# Patient Record
Sex: Female | Born: 2000 | Hispanic: Yes | Marital: Single | Smoking: Never smoker
Health system: Southern US, Community
[De-identification: ages and names within clinical notes are randomized; demographics above are authoritative.]

## PROBLEM LIST (undated history)

## (undated) ENCOUNTER — Emergency Department (HOSPITAL_COMMUNITY): Admission: EM | Payer: BLUE CROSS/BLUE SHIELD

## (undated) DIAGNOSIS — H9202 Otalgia, left ear: Secondary | ICD-10-CM

## (undated) DIAGNOSIS — F419 Anxiety disorder, unspecified: Secondary | ICD-10-CM

## (undated) HISTORY — DX: Otalgia, left ear: H92.02

## (undated) HISTORY — DX: Anxiety disorder, unspecified: F41.9

---
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
## 2022-02-10 ENCOUNTER — Encounter: Payer: Self-pay | Admitting: Pediatrics

## 2022-02-10 ENCOUNTER — Ambulatory Visit (INDEPENDENT_AMBULATORY_CARE_PROVIDER_SITE_OTHER): Payer: Medicaid Other | Admitting: Pediatrics

## 2022-02-10 VITALS — BP 104/72 | HR 74 | Ht 67.0 in | Wt 222.0 lb

## 2022-02-10 DIAGNOSIS — L700 Acne vulgaris: Secondary | ICD-10-CM | POA: Diagnosis not present

## 2022-02-10 DIAGNOSIS — Z68.41 Body mass index (BMI) pediatric, greater than or equal to 95th percentile for age: Secondary | ICD-10-CM | POA: Diagnosis not present

## 2022-02-10 DIAGNOSIS — Z114 Encounter for screening for human immunodeficiency virus [HIV]: Secondary | ICD-10-CM | POA: Diagnosis not present

## 2022-02-10 DIAGNOSIS — Z23 Encounter for immunization: Secondary | ICD-10-CM | POA: Diagnosis not present

## 2022-02-10 DIAGNOSIS — Z0000 Encounter for general adult medical examination without abnormal findings: Secondary | ICD-10-CM

## 2022-02-10 DIAGNOSIS — Z113 Encounter for screening for infections with a predominantly sexual mode of transmission: Secondary | ICD-10-CM

## 2022-02-10 DIAGNOSIS — E669 Obesity, unspecified: Secondary | ICD-10-CM

## 2022-02-10 DIAGNOSIS — H9313 Tinnitus, bilateral: Secondary | ICD-10-CM | POA: Diagnosis not present

## 2022-02-10 DIAGNOSIS — Z1389 Encounter for screening for other disorder: Secondary | ICD-10-CM

## 2022-02-10 LAB — POCT RAPID HIV: Rapid HIV, POC: NEGATIVE

## 2022-02-10 NOTE — Progress Notes (Signed)
Adolescent Well Care Visit Teriann Livingood is a 21 y.o. female who is here for well care.    PCP:  Ancil Linsey, MD   History was provided by the patient.  Confidentiality was discussed with the patient and, if applicable, with caregiver as well. Patient's personal or confidential phone number: [phone]   Current Issues: Current concerns include  Tinnitus - continued left sided ear tinnitus that is now muffled sounding.   Completed first year of college.  States that learning to balance her time was difficult.  Working at [employer].  Thinking about a career in medical field and enjoys working with children.  Volunteers at [employer].   Nutrition: Nutrition/Eating Behaviors: Trying to have a healthier relationship with food and make healthy food choices.  Adequate calcium in diet?: yes  Supplements/ Vitamins: none    Menstruation:   Patient's last menstrual period was 01/31/2022. Menstrual History: coming monthly- encouraged tracker.   Not yet had any sexual experiences.    Confidential Social History: Tobacco?  no Secondhand smoke exposure?  no Drugs/ETOH?  no  Sexually Active?  no   Pregnancy Prevention: n/a  Safe at home, in school & in relationships?  Yes Safe to self?  Yes   Screenings: Patient has a dental home: yes  Physical Exam:  Vitals:   02/10/22 1113  BP: 104/72  Pulse: 74  Weight: 222 lb (100.7 kg)  Height: 5\' 7"  (1.702 m)   BP 104/72   Pulse 74   Ht 5\' 7"  (1.702 m)   Wt 222 lb (100.7 kg)   LMP 01/31/2022   BMI 34.77 kg/m  Body mass index: body mass index is 34.77 kg/m. Blood pressure percentiles are not available for patients who are 18 years or older.  Hearing Screening   500Hz  1000Hz  2000Hz  4000Hz   Right ear 20 20 20 20   Left ear 20 20 20 20    Vision Screening   Right eye Left eye Both eyes  Without correction 10/10 10/10 10/10   With correction       General Appearance:   alert, oriented, no acute distress  and well nourished  HENT: Normocephalic, no obvious abnormality, conjunctiva clear  Mouth:   Normal appearing teeth, no obvious discoloration, dental caries, or dental caps  Neck:   Supple; thyroid: no enlargement, symmetric, no tenderness/mass/nodules  Chest   Lungs:   Clear to auscultation bilaterally, normal work of breathing  Heart:   Regular rate and rhythm, S1 and S2 normal, no murmurs;   Abdomen:   Soft, non-tender, no mass, or organomegaly  GU genitalia not examined  Musculoskeletal:   Tone and strength strong and symmetrical, all extremities               Lymphatic:   No cervical adenopathy  Skin/Hair/Nails:   Skin warm, dry and intact, no rashes, no bruises or petechiae  Neurologic:   Strength, gait, and coordination normal and age-appropriate     Assessment and Plan:   Milana is an 21 yo F with longstanding history of tinnitus here for well exam .  Options limited to tinnitus clinic at [facility] as they did not take isurance. Patient requesting new referral for new opinion.  Transition of care discussed at great length.  Will be establishing care with same practice as mom.    Hearing screening result:normal Vision screening result: normal  Counseling provided for all of the vaccine components  Orders Placed This Encounter  Procedures   Ambulatory referral to ENT  Ambulatory referral to Dermatology   POCT Rapid HIV     Return in 1 year (on 02/11/2023), or if symptoms worsen or fail to improve.Marland Kitchen  Ancil Linsey, MD

## 2022-04-08 ENCOUNTER — Telehealth: Payer: Self-pay | Admitting: Pediatrics

## 2022-04-08 NOTE — Telephone Encounter (Signed)
Patient has not received call from dermatologist or ENT that she was referred to in June2023 .Are we able to check on these referrals ? Patient will be at work , provided mom # for call back  [phone]

## 2022-04-09 NOTE — Telephone Encounter (Signed)
I called and spoke with mom and daughter to inform they have been contacting daughter to get the appointment scheduled. That is the number that is on file. I provided daughter with the two numbers to contact there office and they can help with getting an appointment scheduled.

## 2022-06-09 ENCOUNTER — Ambulatory Visit (INDEPENDENT_AMBULATORY_CARE_PROVIDER_SITE_OTHER): Payer: Medicaid Other | Admitting: Student

## 2022-06-09 VITALS — BP 102/64 | HR 74 | Ht 67.0 in | Wt 201.0 lb

## 2022-06-09 DIAGNOSIS — B079 Viral wart, unspecified: Secondary | ICD-10-CM | POA: Insufficient documentation

## 2022-06-09 DIAGNOSIS — L700 Acne vulgaris: Secondary | ICD-10-CM | POA: Insufficient documentation

## 2022-06-09 MED ORDER — BENZOYL PEROXIDE-ERYTHROMYCIN 5-3 % EX GEL: Freq: Two times a day (BID) | CUTANEOUS | 0 refills | Status: AC

## 2022-06-09 NOTE — Patient Instructions (Signed)
It was great to see you! Thank you for allowing me to participate in your care!  Our plans for today:  - Acne  Benzamycin to skin twice a day, after washing  -Scars/Stretch marks  Dermatology referral for treatment   - Mertha Finders You have an appointment in  Derm procedure clinic, next Thursday 06/17/22 at 3:30 pm  Will have the wart frozen off, may need multiple treatments   Take care and seek immediate care sooner if you develop any concerns.   Dr. Holley Bouche, MD [place]

## 2022-06-09 NOTE — Progress Notes (Signed)
Theresa Sellers SUBJECTIVE:   CHIEF COMPLAINT / HPI:   Worried about facial acne that she gets around her cheeks. Want's to know what can be done about scars. Also has ball on finger. Also wondering if there is anything that can be done about stretch marks under her arm.   Bump on finger: Been there for 2.5 months. No irritation from it, and it randomly appeared. Has gotten bigger after first month but now has stayed same size. Bump is half centimeter in diameter.  Acne: Never seen MD for it. Has tried exfoliants and facial scrubs. Hasn't been as bad recently, but usually appreciates new pimples every couple of weeks. Acne was most notable around age of 21 and has been an issue since. Usually has on face around chin/cheeks and forehead. Can get large but usually doesn't have white tip on it.   PERTINENT  PMH / PSH: Acne   OBJECTIVE:  BP 102/64   Pulse 74   Ht 5\' 7"  (1.702 m)   Wt 201 lb (91.2 kg)   LMP 05/10/2022   SpO2 98%   BMI 31.48 kg/m   General: NAD, pleasant, able to participate in exam Skin: warm and dry, closed comedones noted around border of face, between eye brows, and on chin, no erythema, pustules or nodules, scarring noted Neuro: alert, no obvious focal deficits, speech normal Psych: Normal affect and mood  ASSESSMENT/PLAN:  Acne vulgaris Patient has had acne since age 21, but complains of worsening symptoms. Notes worsening acne on face sporadically, but has never been treated for it. Patient face showing closed comedones and scarring on cheeks. Acne is mild. Patient would benefit from benzyol peroxide and antibiotic topical cream. Will place derm referral for scarring/strech marks. -Benziclin -Amb Derm Ref     Wart of hand Patient appreciates bump on finger for last 2.5 months. Lesion appears to be wart, just under half centimeter in diameter located on left index finger at nail bed. Lesion most concerning for wart. Lesion non fluctuant/not irritating patient, making  cyst/abscess less likely. Patient wanting wart removal, will schedule for Derm Procedure Clinic. -F/u Derm Procedure Clinic for removal     Orders Placed This Encounter  Procedures   Ambulatory referral to Dermatology    Referral Priority:   Routine    Referral Type:   Consultation    Referral Reason:   Specialty Services Required    Requested Specialty:   Dermatology    Number of Visits Requested:   1   Meds ordered this encounter  Medications   benzoyl peroxide-erythromycin (BENZAMYCIN) gel    Sig: Apply topically 2 (two) times daily.    Dispense:  23.3 g    Refill:  0   No follow-ups on file. @SIGNNOTE @

## 2022-06-09 NOTE — Assessment & Plan Note (Signed)
Patient appreciates bump on finger for last 2.5 months. Lesion appears to be wart, just under half centimeter in diameter located on left index finger at nail bed. Lesion most concerning for wart. Lesion non fluctuant/not irritating patient, making cyst/abscess less likely. Patient wanting wart removal, will schedule for Derm Procedure Clinic. -F/u Derm Procedure Clinic for removal

## 2022-06-09 NOTE — Assessment & Plan Note (Addendum)
Patient has had acne since age 21, but complains of worsening symptoms. Notes worsening acne on face sporadically, but has never been treated for it. Patient face showing closed comedones and scarring on cheeks. Acne is mild. Patient would benefit from benzyol peroxide and antibiotic topical cream. Will place derm referral for scarring/strech marks. -Benziclin -Amb Derm Ref

## 2022-06-17 ENCOUNTER — Ambulatory Visit (INDEPENDENT_AMBULATORY_CARE_PROVIDER_SITE_OTHER): Payer: Medicaid Other | Admitting: Family Medicine

## 2022-06-17 VITALS — BP 108/80 | HR 73 | Ht 67.0 in | Wt 199.8 lb

## 2022-06-17 DIAGNOSIS — B079 Viral wart, unspecified: Secondary | ICD-10-CM | POA: Diagnosis not present

## 2022-06-17 NOTE — Assessment & Plan Note (Signed)
<  1cm wart on left index finger that appeared 3 months ago. No associated pain. - Cryotherapy of wart performed - Reevaluate in office in 2-3 weeks

## 2022-06-17 NOTE — Progress Notes (Signed)
    SUBJECTIVE:   HPI:  Finger lesion x 3 months. See medical student's note above.   Cryotherapy  Preoperative diagnosis: Left index finger wart  Postoperative diagnosis: same  Procedure: Cryotherapy of Cervix  Surgeon: Andrena Mews  Preprocedure counseling: The risks, benefits, and alternatives of the procedure were discussed with the patient.    EBL: 0 ml  Anesthesia: None   Procedure:  Lesions identified: The faulx swab dabbed in liquid nitrogen was applied to the lesion on her left index finger for 3 seconds until an ice ball formed with a 5-7 mm border.  This was allowed to thaw and then the cryotherapy was again applied for 3 seconds to an ice ball of 5-7 mm x 2.     The patient tolerated the procedure well.  Return precautions provided.   Return to the office in 2-3 weeks for reevaluation.

## 2022-06-17 NOTE — Patient Instructions (Signed)
Cryosurgery for Skin Conditions Cryosurgery is the use of a very cold liquid (liquid nitrogen) to treat abnormal or diseased tissue. This treatment is also called cryotherapy. It can freeze or take away growths on skin, such as: Warts. Skin sores that could become cancer. Some skin cancers. This treatment normally takes a few minutes. It can be done in your doctor's office. Tell a doctor about: Any allergies you have. All medicines you are taking, including vitamins, herbs, eye drops, creams, and over-the-counter medicines. Any problems you or family members have had with medicines that make you fall asleep (anesthetic medicines). Any blood disorders you have. Any surgeries you have had. Any medical conditions you have. Whether you are pregnant or may be pregnant. What are the risks? Generally, this is a safe treatment. However, problems may occur, including: Infection. Bleeding. Scarring. Changes in skin color (lighter or darker than normal skin tone). Swelling. Hair loss in the treated area. Damage to nearby parts or organs, such as nerve damage and loss of feeling. This is rare. What happens before the procedure? You do not have to do anything to get ready for this treatment. Your doctor will talk with you about: The treatment. The benefits and risks. What happens during the procedure?  Your treatment will be done in one of these two ways: Your doctor may use a tool (probe) on your skin. The tool has very cold liquid in it to cool it down. The tool will be used until the skin is frozen and killed. Your doctor may use a swab or spray to get the very cold liquid onto your skin. Your doctor will keep using the very cold liquid until the skin is frozen and killed. A bandage (dressing) may be put on the area. These procedures may vary among doctors and clinics. What can I expect after the treatment? After your treatment, it is common to have: Redness over the treated area. Swelling  over the treated area. A blister that forms over the treated area. The blister may have a little blood in it. You may also have some mild stinging or a burning feeling that will go away. If a blister forms, it will break open on its own after about 2-4 weeks. This will leave a scab. Then the treated area will heal. After healing, there is normally little or no scarring. Follow these instructions at home: Caring for the treated area  Follow instructions from your doctor about how to take care of your treated area. If you have a bandage, make sure you: Wash your hands with soap and water for at least 20 seconds before and after you change your bandage. If you cannot use soap and water, use hand sanitizer. Change your bandage as told by your doctor. Keep the bandage and the treated area clean and dry. If the bandage gets wet, change it right away. Clean the treated area with soap and water. Keep the area covered with a bandage until it heals, or for as long as told by your doctor. Check the treated area every day for signs of infection. Check for: More redness, swelling, or pain. More fluid or blood. Warmth. Pus or a bad smell. If you have a blister: Do not pick at it. Doing this can cause infection and scarring. Do not try to break it open. Doing this can cause infection and scarring. Do not put any medicine, cream, or lotion on the treated area unless told by your doctor. Bathing Until your doctor approves: Do   not take baths. Do not swim. Do not use a hot tub. Do not hand-wash dishes. Do not soak the treated area in other ways. Ask your doctor if you may take showers. You may only be allowed to take sponge baths. General instructions Take over-the-counter and prescription medicines only as told by your doctor. Do not use any products that contain nicotine or tobacco, such as cigarettes, e-cigarettes, and chewing tobacco. These can delay healing. If you need help quitting, ask your  doctor. Keep all follow-up visits as told by your doctor. This is important. Contact a doctor if: You have any of these signs of infection in or around your treated area: More redness. More swelling. More pain. More fluid. More blood. Warmth. Pus or a bad smell. Your blister grows large and causes pain. Get help right away if: You have a fever. You have redness that spreads from the treated area. Summary Cryosurgery uses a very cold liquid to freeze or take away growths on skin. It is also called cryotherapy. Generally, this is a safe treatment. You do not have to do anything to get ready for it. Your doctor will freeze or kill the skin in one of two ways. One way is with a tool (probe) that has the very cold liquid in it. The other way is to spread the very cold liquid onto your skin with a swab or spray. After your treatment, follow care instructions from your doctor. Watch for infection. If you have a blister, do not pick at it or break it open. This information is not intended to replace advice given to you by your health care provider. Make sure you discuss any questions you have with your health care provider. Document Revised: 04/11/2019 Document Reviewed: 04/11/2019 Elsevier Patient Education  2023 [employer] Inc[employer]  

## 2022-06-17 NOTE — Progress Notes (Signed)
    Subjective: Patient here for wart on left index finger that appeared 3 months ago. Initially it grew in size and it appears to be stable. Patient denies pain. Notable history of similar wart on mom's finger. She asked for details regarding the cryotherapy procedure and signed informed consent.  Objective: Blood pressure 108/80, pulse 73, height 5\' 7"  (1.702 m), weight 199 lb 12.8 oz (90.6 kg), last menstrual period 05/31/2022, SpO2 99 %.   Physical Exam: Derm: <1cm raised wart on left index finger  Assessment & Plan:  Wart of Finger <1cm wart on left index finger that appeared 3 months ago. No associated pain. - Cryotherapy of wart performed - Reevaluate in office in 2-3 weeks  Cryotherapy  Preoperative diagnosis: Left index finger wart  Postoperative diagnosis: same  Procedure: Cryotherapy of wart on finger  Surgeon: Andrena Mews  Preprocedure counseling: The risks, benefits, and alternatives of the procedure were discussed with the patient.    EBL: 0 ml  Anesthesia: None   Procedure:  Lesions identified: The faulx swab dabbed in liquid nitrogen was applied to the lesion on her left index finger for 3 seconds until an ice ball formed with a 5-7 mm border.  This was allowed to thaw and then the cryotherapy was again applied for 3 seconds to an ice ball of 5-7 mm x 2.     The patient tolerated the procedure well.  Return precautions provided.   Return to the office in 2-3 weeks for reevaluation.

## 2022-07-20 ENCOUNTER — Ambulatory Visit: Payer: Medicaid Other

## 2022-08-19 ENCOUNTER — Ambulatory Visit
Admission: EM | Admit: 2022-08-19 | Discharge: 2022-08-19 | Disposition: A | Discharge status: Home / Self Care | Payer: Medicaid Other | Attending: Physician Assistant | Admitting: Physician Assistant

## 2022-08-19 DIAGNOSIS — B070 Plantar wart: Secondary | ICD-10-CM | POA: Diagnosis not present

## 2022-08-19 MED ORDER — SALICYLIC ACID 27.5 % EX LIQD: CUTANEOUS | 0 refills | Status: AC

## 2022-08-19 NOTE — ED Provider Notes (Signed)
[name]    CSN: [id number] Arrival date & time: 08/19/22  1102      History   Chief Complaint Chief Complaint  Patient presents with   Foreign Body    HPI Theresa Sellers is a 21 y.o. female.   Patient here today for evaluation of possible foreign body to right foot. She reports that she felt as if she stepped on something about 2 weeks ago but did not see anything in the floor when symptoms started. She reports that a few hours after initial discomfort she did try to express FB if present but did not have anything come from area of discomfort. She has not had fever. Denies numbness or other symptoms.  The history is provided by the patient.  Foreign Body Associated symptoms: no nausea and no vomiting     Past Medical History:  Diagnosis Date   Anxious mood    Influenza A 10/02/2018   Otalgia of left ear     Patient Active Problem List   Diagnosis Date Noted   Acne vulgaris 06/09/2022   Wart of hand 06/09/2022   Abscess of superficial perineal space 01/22/2020   Vitamin D deficiency 07/24/2019   BMI (body mass index), pediatric, greater than or equal to 95% for age 59/12/2018   Anxiety 07/11/2019   Failed hearing screening 07/11/2019   Middle ear effusion, left 07/11/2019    History reviewed. No pertinent surgical history.  OB History   No obstetric history on file.      Home Medications    Prior to Admission medications   Medication Sig Start Date End Date Taking? Authorizing Provider  Salicylic Acid 27.5 % LIQD Soak foot in warm water for 5 minutes then dry- apply salicylic acid to entire wart surface, allow to dry then reapply. Use 1-2 times daily for 4 weeks or until wart resolves. 08/19/22  Yes Tomi Bamberger, PA-C  benzoyl peroxide-erythromycin [facility]) gel Apply topically 2 (two) times daily. 06/09/22   Bess Kinds, MD  cetirizine (ZYRTEC) 10 MG tablet Take 10 mg by mouth as needed for allergies.    [provider]   [facility] D3 10 MCG (400 UNIT) CAPS TAKE 1 TABLET (400 UNITS TOTAL) BY MOUTH DAILY. 12/18/19   Kalman Jewels, MD  fexofenadine (ALLEGRA) 180 MG tablet Take 1 tablet (180 mg total) by mouth daily. 05/04/19 06/03/19  Stryffeler, Jonathon Jordan, NP  fluticasone (FLONASE) 50 MCG/ACT nasal spray Place 1 spray into both nostrils daily. 09/25/19   Florestine Avers [place], MD  ibuprofen (ADVIL) 200 MG tablet Take 200 mg by mouth every 6 (six) hours as needed.    [provider]  Magnesium Oxide 500 MG TABS Take 1 tablet (500 mg total) by mouth daily. 06/11/20   Keturah Shavers, MD  mupirocin ointment (BACTROBAN) 2 % Apply 1 application topically 2 (two) times daily. 01/22/20   Darrall Dears, MD  polyethylene glycol powder (GLYCOLAX/MIRALAX) 17 GM/SCOOP powder Take 17 g by mouth daily. 11/27/21   Ancil Linsey, MD  riboflavin (VITAMIN B-2) 100 MG TABS tablet Take 1 tablet (100 mg total) by mouth daily. 06/11/20   Keturah Shavers, MD  topiramate (TOPAMAX) 50 MG tablet Take 1 tablet (50 mg total) by mouth 2 (two) times daily. (Start with half a tablet twice daily for the first week) 11/04/20   Keturah Shavers, MD    Family History Family History  Problem Relation Age of Onset   Cancer Maternal Grandmother    Hypertension Maternal Grandfather  Childhood respiratory disease Brother 6   Obesity Brother 5   Developmental delay Brother 5   Gait disorder Brother 7   Bowel Disease Brother 6    (Bilateral pes planus) Brother 7    (Enlarged tonsils) Brother 6    (Failed hearing screening) Brother 5    (Influenza A) Brother 7    (Lymph node enlargement) Brother 6    (Nevus) Brother 6    (Pterygium of right eye) Brother 5   Migraines Neg Hx    Seizures Neg Hx    Autism Neg Hx    ADD / ADHD Neg Hx    Anxiety disorder Neg Hx    Depression Neg Hx    Bipolar disorder Neg Hx    Schizophrenia Neg Hx     Social History Social History   Tobacco Use   Smoking status: Never   Smokeless tobacco: Never   Substance Use Topics   Alcohol use: No   Drug use: Never     Allergies   Patient has no known allergies.   Review of Systems Review of Systems  Constitutional:  Negative for chills and fever.  Eyes:  Negative for discharge and redness.  Gastrointestinal:  Negative for nausea and vomiting.  Musculoskeletal:  Negative for arthralgias and joint swelling.  Skin:  Negative for color change and wound.     Physical Exam Triage Vital Signs ED Triage Vitals  Enc Vitals Group     BP 08/19/22 1209 110/77     Pulse Rate 08/19/22 1209 75     Resp 08/19/22 1209 18     Temp 08/19/22 1209 98.2 F (36.8 C)     Temp Source 08/19/22 1209 Oral     SpO2 08/19/22 1209 98 %     Weight --      Height --      Head Circumference --      Peak Flow --      Pain Score 08/19/22 1211 2     Pain Loc --      Pain Edu? --      Excl. in GC? --    No data found.  Updated Vital Signs BP 110/77 (BP Location: Left Arm)   Pulse 75   Temp 98.2 F (36.8 C) (Oral)   Resp 18   LMP  (LMP Unknown)   SpO2 98%     Physical Exam Vitals and nursing note reviewed.  Constitutional:      General: She is not in acute distress.    Appearance: Normal appearance. She is not ill-appearing.  HENT:     Head: Normocephalic and atraumatic.  Eyes:     Conjunctiva/sclera: Conjunctivae normal.  Cardiovascular:     Rate and Rhythm: Normal rate.  Pulmonary:     Effort: Pulmonary effort is normal. No respiratory distress.  Skin:    Comments: Annular rough slightly raised lesion to right sole of foot without obvious wound, erythema  Neurological:     Mental Status: She is alert.  Psychiatric:        Mood and Affect: Mood normal.        Behavior: Behavior normal.        Thought Content: Thought content normal.      UC Treatments / Results  Labs (all labs ordered are listed, but only abnormal results are displayed) Labs Reviewed - No data to display  EKG   Radiology No results  found.  Procedures Procedures (including critical care time)  Medications Ordered  in UC Medications - No data to display  Initial Impression / Assessment and Plan / UC Course  I have reviewed the triage vital signs and the nursing notes.  Pertinent labs & imaging results that were available during my care of the patient were reviewed by me and considered in my medical decision making (see chart for details).    Suspect plantar wart vs corn to bottom of right foot. Recommend salicylic acid and follow up if no gradual improvement or with any further concerns. Patient expresses understanding.   Final Clinical Impressions(s) / UC Diagnoses   Final diagnoses:  Plantar wart of left foot   Discharge Instructions   None    ED Prescriptions     Medication Sig Dispense Auth. Provider   Salicylic Acid 27.5 % LIQD Soak foot in warm water for 5 minutes then dry- apply salicylic acid to entire wart surface, allow to dry then reapply. Use 1-2 times daily for 4 weeks or until wart resolves. 10 mL Tomi Bamberger, PA-C      PDMP not reviewed this encounter.   Tomi Bamberger, PA-C 08/19/22 1307

## 2022-08-19 NOTE — ED Triage Notes (Signed)
Pt presents with foreign body in bottom of right foot X 2 weeks.

## 2022-12-16 NOTE — Progress Notes (Unsigned)
A user error has taken place: encounter opened in error, closed for administrative reasons.Cancelled

## 2023-05-16 ENCOUNTER — Ambulatory Visit (INDEPENDENT_AMBULATORY_CARE_PROVIDER_SITE_OTHER): Payer: Medicaid Other | Admitting: Family Medicine

## 2023-05-16 VITALS — BP 102/68 | Ht 67.0 in | Wt 200.0 lb

## 2023-05-16 DIAGNOSIS — M2142 Flat foot [pes planus] (acquired), left foot: Secondary | ICD-10-CM

## 2023-05-16 DIAGNOSIS — M2141 Flat foot [pes planus] (acquired), right foot: Secondary | ICD-10-CM

## 2023-05-17 ENCOUNTER — Encounter: Payer: Self-pay | Admitting: Family Medicine

## 2023-05-17 NOTE — Progress Notes (Signed)
PCP: Pcp, No  Subjective:   HPI: Patient is a 22 y.o. female here for foot, knee pain.  12/06/20: Patient reports overall she's doing well since last visit. Does get pain medial and lateral knees. Reports inserts helped with this pain. Also had pain in arches helped by the inserts. Unable to wear inserts in her soccer cleats as they're too tight. Noticed on inside of right great toe, foot developed a couple blisters.  11/16/21: Patient is doing well. Reports some anterior knee pain bilaterally especially when it's cold outside. No pain currently. Does well with arch supports though current ones are broken down. Has bought a new pair of shoes online and feel a little more supportive than older ones.  05/16/23: Patient returns for new sports insoles. No pain currently.  Past Medical History:  Diagnosis Date   Anxious mood    Influenza A 10/02/2018   Otalgia of left ear     Current Outpatient Medications on File Prior to Visit  Medication Sig Dispense Refill   benzoyl peroxide-erythromycin (BENZAMYCIN) gel Apply topically 2 (two) times daily. 23.3 g 0   cetirizine (ZYRTEC) 10 MG tablet Take 10 mg by mouth as needed for allergies.     [employer] D3 10 MCG (400 UNIT) CAPS TAKE 1 TABLET (400 UNITS TOTAL) BY MOUTH DAILY. 30 capsule 3   fexofenadine (ALLEGRA) 180 MG tablet Take 1 tablet (180 mg total) by mouth daily. 30 tablet 1   fluticasone (FLONASE) 50 MCG/ACT nasal spray Place 1 spray into both nostrils daily. 9.9 mL 11   ibuprofen (ADVIL) 200 MG tablet Take 200 mg by mouth every 6 (six) hours as needed.     Magnesium Oxide 500 MG TABS Take 1 tablet (500 mg total) by mouth daily.  0   mupirocin ointment (BACTROBAN) 2 % Apply 1 application topically 2 (two) times daily. 22 g 0   polyethylene glycol powder (GLYCOLAX/MIRALAX) 17 GM/SCOOP powder Take 17 g by mouth daily. 527 g 3   riboflavin (VITAMIN B-2) 100 MG TABS tablet Take 1 tablet (100 mg total) by mouth daily.  0   Salicylic Acid 27.5  % LIQD Soak foot in warm water for 5 minutes then dry- apply salicylic acid to entire wart surface, allow to dry then reapply. Use 1-2 times daily for 4 weeks or until wart resolves. 10 mL 0   topiramate (TOPAMAX) 50 MG tablet Take 1 tablet (50 mg total) by mouth 2 (two) times daily. (Start with half a tablet twice daily for the first week) 60 tablet 2   No current facility-administered medications on file prior to visit.    History reviewed. No pertinent surgical history.  No Known Allergies  Social History   Socioeconomic History   Marital status: Single    Spouse name: Not on file   Number of children: Not on file   Years of education: Not on file   Highest education level: Not on file  Occupational History   Not on file  Tobacco Use   Smoking status: Never   Smokeless tobacco: Never  Substance and Sexual Activity   Alcohol use: No   Drug use: Never   Sexual activity: Not Currently  Other Topics Concern   Not on file  Social History Narrative   Lives with mom, dad, sister and brother. She is in the 12th grade at [employer]   Social Determinants of Health   Financial Resource Strain: Not on file  Food Insecurity: Not on file  Transportation  Needs: Not on file  Physical Activity: Not on file  Stress: Not on file  Social Connections: Not on file  Intimate Partner Violence: Not on file    Family History  Problem Relation Age of Onset   Cancer Maternal Grandmother    Hypertension Maternal Grandfather    Childhood respiratory disease Brother 6   Obesity Brother 5   Developmental delay Brother 5   Gait disorder Brother 7   Bowel Disease Brother 6    (Bilateral pes planus) Brother 7    (Enlarged tonsils) Brother 6    (Failed hearing screening) Brother 5    (Influenza A) Brother 7    (Lymph node enlargement) Brother 6    (Nevus) Brother 6    (Pterygium of right eye) Brother 5   Migraines Neg Hx    Seizures Neg Hx    Autism Neg Hx    ADD / ADHD Neg Hx     Anxiety disorder Neg Hx    Depression Neg Hx    Bipolar disorder Neg Hx    Schizophrenia Neg Hx     BP 102/68   Ht 5\' 7"  (1.702 m)   Wt 200 lb (90.7 kg)   BMI 31.32 kg/m       No data to display             11/16/2021   10:22 AM  [facility]olescent [facility]  Frequency of at least 60 minutes physical activity (# days/week) 2    Review of Systems: See HPI above.     Objective:  Physical Exam:  Gen: NAD, comfortable in exam room  Bilateral feet: Bilateral pes planus   Assessment & Plan:  1. 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  Improved with sports insoles.  Discussed custom orthotics as well though may not be covered by insurance.  New sports insoles with scaphoid pads provided.  Tylenol, ibuprofen if needed.

## 2023-05-23 ENCOUNTER — Ambulatory Visit
Admission: EM | Admit: 2023-05-23 | Discharge: 2023-05-23 | Disposition: A | Discharge status: Home / Self Care | Payer: Medicaid Other | Attending: Internal Medicine | Admitting: Internal Medicine

## 2023-05-23 DIAGNOSIS — B354 Tinea corporis: Secondary | ICD-10-CM

## 2023-05-23 MED ORDER — CLOTRIMAZOLE 1 % EX CREA: TOPICAL_CREAM | CUTANEOUS | 0 refills | Status: DC

## 2023-05-23 NOTE — ED Provider Notes (Signed)
[name]    CSN: [id number] Arrival date & time: 05/23/23  1643      History   Chief Complaint Chief Complaint  Patient presents with   Rash    HPI Theresa Sellers is a 22 y.o. female.   Patient presents with 2 areas of circular itchy rash to left hip for the past 3 to 4 days.  Reports that her brother has a similar rash.  Denies any fever.  Has applied an anti-itch cream with minimal improvement.  Patient also wants a mole evaluated to the right hip.  Reports that it has been present for "a while" but is starting to get a black discoloration to the center.   Rash   Past Medical History:  Diagnosis Date   Anxious mood    Influenza A 10/02/2018   Otalgia of left ear     Patient Active Problem List   Diagnosis Date Noted   Acne vulgaris 06/09/2022   Wart of hand 06/09/2022   Abscess of superficial perineal space 01/22/2020   Vitamin D deficiency 07/24/2019   BMI (body mass index), pediatric, greater than or equal to 95% for age 13/12/2018   Anxiety 07/11/2019   Failed hearing screening 07/11/2019   Middle ear effusion, left 07/11/2019    History reviewed. No pertinent surgical history.  OB History   No obstetric history on file.      Home Medications    Prior to Admission medications   Medication Sig Start Date End Date Taking? Authorizing Provider  clotrimazole (LOTRIMIN) 1 % cream Apply to affected area 2 times daily 05/23/23  Yes Hondo Nanda, Briggs E, FNP  benzoyl peroxide-erythromycin [facility]) gel Apply topically 2 (two) times daily. 06/09/22   Bess Kinds, MD  cetirizine (ZYRTEC) 10 MG tablet Take 10 mg by mouth as needed for allergies.    [provider]  [facility] D3 10 MCG (400 UNIT) CAPS TAKE 1 TABLET (400 UNITS TOTAL) BY MOUTH DAILY. 12/18/19   Kalman Jewels, MD  fexofenadine (ALLEGRA) 180 MG tablet Take 1 tablet (180 mg total) by mouth daily. 05/04/19 06/03/19  Stryffeler, Jonathon Jordan, NP  fluticasone (FLONASE) 50 MCG/ACT nasal  spray Place 1 spray into both nostrils daily. 09/25/19   Florestine Avers [place], MD  ibuprofen (ADVIL) 200 MG tablet Take 200 mg by mouth every 6 (six) hours as needed.    [provider]  Magnesium Oxide 500 MG TABS Take 1 tablet (500 mg total) by mouth daily. 06/11/20   Keturah Shavers, MD  mupirocin ointment (BACTROBAN) 2 % Apply 1 application topically 2 (two) times daily. 01/22/20   Darrall Dears, MD  polyethylene glycol powder (GLYCOLAX/MIRALAX) 17 GM/SCOOP powder Take 17 g by mouth daily. 11/27/21   Ancil Linsey, MD  riboflavin (VITAMIN B-2) 100 MG TABS tablet Take 1 tablet (100 mg total) by mouth daily. 06/11/20   Keturah Shavers, MD  Salicylic Acid 27.5 % LIQD Soak foot in warm water for 5 minutes then dry- apply salicylic acid to entire wart surface, allow to dry then reapply. Use 1-2 times daily for 4 weeks or until wart resolves. 08/19/22   Tomi Bamberger, PA-C  topiramate (TOPAMAX) 50 MG tablet Take 1 tablet (50 mg total) by mouth 2 (two) times daily. (Start with half a tablet twice daily for the first week) 11/04/20   Keturah Shavers, MD    Family History Family History  Problem Relation Age of Onset   Cancer Maternal Grandmother    Hypertension Maternal Grandfather  Childhood respiratory disease Brother 6   Obesity Brother 5   Developmental delay Brother 5   Gait disorder Brother 7   Bowel Disease Brother 6    (Bilateral pes planus) Brother 7    (Enlarged tonsils) Brother 6    (Failed hearing screening) Brother 5    (Influenza A) Brother 7    (Lymph node enlargement) Brother 6    (Nevus) Brother 6    (Pterygium of right eye) Brother 5   Migraines Neg Hx    Seizures Neg Hx    Autism Neg Hx    ADD / ADHD Neg Hx    Anxiety disorder Neg Hx    Depression Neg Hx    Bipolar disorder Neg Hx    Schizophrenia Neg Hx     Social History Social History   Tobacco Use   Smoking status: Never   Smokeless tobacco: Never  Substance Use Topics   Alcohol use: No   Drug  use: Never     Allergies   Patient has no known allergies.   Review of Systems Review of Systems Per HPI  Physical Exam Triage Vital Signs ED Triage Vitals  Encounter Vitals Group     BP 05/23/23 1703 113/73     Systolic BP Percentile --      Diastolic BP Percentile --      Pulse Rate 05/23/23 1703 (!) 104     Resp 05/23/23 1703 16     Temp 05/23/23 1703 98.4 F (36.9 C)     Temp Source 05/23/23 1703 Oral     SpO2 05/23/23 1703 96 %     Weight 05/23/23 1702 203 lb (92.1 kg)     Height 05/23/23 1702 5\' 7"  (1.702 m)     Head Circumference --      Peak Flow --      Pain Score 05/23/23 1702 0     Pain Loc --      Pain Education --      Exclude from Growth Chart --    No data found.  Updated Vital Signs BP 113/73 (BP Location: Left Arm)   Pulse (!) 104   Temp 98.4 F (36.9 C) (Oral)   Resp 16   Ht 5\' 7"  (1.702 m)   Wt 203 lb (92.1 kg)   LMP 05/17/2023 (Exact Date)   SpO2 96%   BMI 31.79 kg/m   Visual Acuity Right Eye Distance:   Left Eye Distance:   Bilateral Distance:    Right Eye Near:   Left Eye Near:    Bilateral Near:     Physical Exam Constitutional:      General: She is not in acute distress.    Appearance: Normal appearance. She is not toxic-appearing or diaphoretic.  HENT:     Head: Normocephalic and atraumatic.  Eyes:     Extraocular Movements: Extraocular movements intact.     Conjunctiva/sclera: Conjunctivae normal.  Pulmonary:     Effort: Pulmonary effort is normal.  Skin:    Comments: Patient has 2 circular, flat erythematous areas directly beside each other that are approximately 2.5 cm in diameter present to left lateral hip.  Patient has approximately 1 mm mole like lesion present to right lateral hip.  Center does have a black discoloration that is pinpoint.    Neurological:     General: No focal deficit present.     Mental Status: She is alert and oriented to person, place, and time. Mental status is at baseline.  Psychiatric:         Mood and Affect: Mood normal.        Behavior: Behavior normal.        Thought Content: Thought content normal.        Judgment: Judgment normal.      UC Treatments / Results  Labs (all labs ordered are listed, but only abnormal results are displayed) Labs Reviewed - No data to display  EKG   Radiology No results found.  Procedures Procedures (including critical care time)  Medications Ordered in UC Medications - No data to display  Initial Impression / Assessment and Plan / UC Course  I have reviewed the triage vital signs and the nursing notes.  Pertinent labs & imaging results that were available during my care of the patient were reviewed by me and considered in my medical decision making (see chart for details).     1.  Tinea corporis  Rash to left hip appears to be consistent with fungal infection of the skin.  Will treat with topical clotrimazole.  Patient advised to follow-up if symptoms persist or worsen.  2.  Mole to right hip  Patient has a mole to right hip and reports that the color has changed recently, and it does have a black discoloration to the center.  Advised patient to follow-up with her established dermatologist as it may need biopsy.  Biopsy cannot be performed here in urgent care.  Patient verbalized understanding and was agreeable with plan. Final Clinical Impressions(s) / UC Diagnoses   Final diagnoses:  Tinea corporis     Discharge Instructions      You have ringworm which is a fungal infection of the skin so I have prescribed a cream to apply to area.  This can take multiple weeks to resolve.  Follow-up with dermatologist for mole.    ED Prescriptions     Medication Sig Dispense Auth. Provider   clotrimazole (LOTRIMIN) 1 % cream Apply to affected area 2 times daily 60 g Gustavus Bryant, [place]      PDMP not reviewed this encounter.   Gustavus Bryant, [place] 05/23/23 1800

## 2023-05-23 NOTE — ED Triage Notes (Signed)
Patient here today with c/o circular rash on left hip X 3-4 days. She has been using an anti itch cream with no relief.

## 2023-05-23 NOTE — Discharge Instructions (Signed)
You have ringworm which is a fungal infection of the skin so I have prescribed a cream to apply to area.  This can take multiple weeks to resolve.  Follow-up with dermatologist for mole.

## 2023-07-21 ENCOUNTER — Ambulatory Visit (INDEPENDENT_AMBULATORY_CARE_PROVIDER_SITE_OTHER): Payer: Medicaid Other | Admitting: Family

## 2023-07-21 ENCOUNTER — Encounter: Payer: Self-pay | Admitting: Family

## 2023-07-21 VITALS — BP 114/73 | HR 92 | Temp 98.9°F | Ht 68.25 in | Wt 204.2 lb

## 2023-07-21 DIAGNOSIS — H9202 Otalgia, left ear: Secondary | ICD-10-CM

## 2023-07-21 DIAGNOSIS — L309 Dermatitis, unspecified: Secondary | ICD-10-CM | POA: Diagnosis not present

## 2023-07-21 DIAGNOSIS — R059 Cough, unspecified: Secondary | ICD-10-CM | POA: Diagnosis not present

## 2023-07-21 DIAGNOSIS — B354 Tinea corporis: Secondary | ICD-10-CM

## 2023-07-21 DIAGNOSIS — Z7689 Persons encountering health services in other specified circumstances: Secondary | ICD-10-CM

## 2023-07-21 MED ORDER — TRIAMCINOLONE ACETONIDE 0.025 % EX CREA: 1.0000 | TOPICAL_CREAM | Freq: Two times a day (BID) | CUTANEOUS | 0 refills | Status: AC

## 2023-07-21 MED ORDER — BENZONATATE 100 MG PO CAPS: 100.0000 mg | ORAL_CAPSULE | Freq: Three times a day (TID) | ORAL | 1 refills | Status: DC | PRN

## 2023-07-21 MED ORDER — CLOTRIMAZOLE 1 % EX CREA: TOPICAL_CREAM | CUTANEOUS | 0 refills | Status: AC

## 2023-07-21 NOTE — Progress Notes (Signed)
Subjective:    Theresa Sellers - 22 y.o. female MRN [id number]  Date of birth: 11-21-2000  HPI  Theresa Sellers is to establish care.  Current issues and/or concerns: - States seen several months ago at Urgent Care for "ring worm" of left buttocks. Reports symptoms improved with Clotrimazole. However, several days ago noticed returning to the same area. Requests refills of Clotrimazole. 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 States "underneath bra strap". Denies red flag symptoms.  - Nonproductive cough for 2 months. Worse during nighttime. Denies red flag symptoms. Reports she is frequently around people who smoke. - States in the past seen by ENT for "left ear pounding and echo sound". She denies red flag symptoms. Reports the first ENT recommended surgery and she declined. Reports second opinion ENT stated "there is nothing they can do". Requests referral back to initial ENT Dr. Corliss Marcus.  - No further issues/concerns for discussion today.    ROS per HPI     Health Maintenance:  Health Maintenance Due  Topic Date Due   Hepatitis C Screening  Never done   COVID-19 Vaccine (1 - 2023-24 season) Never done   DTaP/Tdap/Td (7 - Td or Tdap) 07/04/2023     Past Medical History: Patient Active Problem List   Diagnosis Date Noted   Acne vulgaris 06/09/2022   Wart of hand 06/09/2022   Abscess of superficial perineal space 01/22/2020   Vitamin D deficiency 07/24/2019   BMI (body mass index), pediatric, greater than or equal to 95% for age 28/12/2018   Anxiety 07/11/2019   Failed hearing screening 07/11/2019   Middle ear effusion, left 07/11/2019      Social History   reports that she has never smoked. She has never used smokeless tobacco. She reports that she does not drink alcohol and does not use drugs.   Family History  family history includes Bilateral pes planus (age of onset: 13) in her brother; Bowel Disease (age of onset: 17) in her brother; Cancer in her  maternal grandmother; Childhood respiratory disease (age of onset: 20) in her brother; Developmental delay (age of onset: 5) in her brother; Enlarged tonsils (age of onset: 58) in her brother; Failed hearing screening (age of onset: 5) in her brother; Gait disorder (age of onset: 58) in her brother; Hypertension in her maternal grandfather; Influenza A (age of onset: 52) in her brother; Lymph node enlargement (age of onset: 13) in her brother; Nevus (age of onset: 36) in her brother; Obesity (age of onset: 5) in her brother; Pterygium of right eye (age of onset: 5) in her brother.   Medications: reviewed and updated   Objective:   Physical Exam BP 114/73   Pulse 92   Temp 98.9 F (37.2 C) (Oral)   Ht 5' 8.25" (1.734 m)   Wt 204 lb 3.2 oz (92.6 kg)   LMP 07/12/2023 (Exact Date)   SpO2 96%   BMI 30.82 kg/m   Physical Exam HENT:     Head: Normocephalic and atraumatic.     Right Ear: Tympanic membrane, ear canal and external ear normal.     Left Ear: Tympanic membrane, ear canal and external ear normal.     Nose: Nose normal.     Mouth/Throat:     Mouth: Mucous membranes are moist.     Pharynx: Oropharynx is clear.  Eyes:     Extraocular Movements: Extraocular movements intact.     Conjunctiva/sclera: Conjunctivae normal.     Pupils: Pupils are equal,  round, and reactive to light.  Cardiovascular:     Rate and Rhythm: Normal rate and regular rhythm.     Pulses: Normal pulses.     Heart sounds: Normal heart sounds.  Pulmonary:     Effort: Pulmonary effort is normal.     Breath sounds: Normal breath sounds.  Musculoskeletal:        General: Normal range of motion.     Cervical back: Normal range of motion and neck supple.  Skin:    Comments: Eczema.   Neurological:     General: No focal deficit present.     Mental Status: She is alert and oriented to person, place, and time.  Psychiatric:        Mood and Affect: Mood normal.        Behavior: Behavior normal.          Assessment & Plan:  1. Encounter to establish care - Patient presents today to establish care. During the interim follow-up with primary provider as scheduled.  - Return for annual physical examination, labs, and health maintenance. Arrive fasting meaning having no food for at least 8 hours prior to appointment. You may have only water or black coffee. Please take scheduled medications as normal.  2. Tinea corporis - Continue Clotrimazole as prescribed. Counseled on medication adherence/adverse effects.  - Referral to Dermatology for evaluation/management. During the interim follow-up with primary provider as scheduled until established with referral. - clotrimazole (LOTRIMIN) 1 % cream; Apply to affected area 2 times daily  Dispense: 60 g; Refill: 0 - Ambulatory referral to Dermatology  3. Eczema, unspecified type - Triamcinolone cream as prescribed. Counseled on medication adherence/adverse effects.  - Referral to Dermatology for evaluation/management. During the interim follow-up with primary provider as scheduled until established with referral. - Ambulatory referral to Dermatology - triamcinolone (KENALOG) 0.025 % cream; Apply 1 Application topically 2 (two) times daily.  Dispense: 60 g; Refill: 0  4. Cough, unspecified type - Patient today in office with no cardiopulmonary/acute distress.  - Benzonatate capsules as prescribed. Counseled on medication adherence/adverse effects.  - Follow-up with primary provider as scheduled.  - benzonatate (TESSALON PERLES) 100 MG capsule; Take 1 capsule (100 mg total) by mouth 3 (three) times daily as needed for cough.  Dispense: 30 capsule; Refill: 1 - COVID-19, Flu A+B and RSV  5. Discomfort of left ear - Referral to ENT for evaluation/management.  - Ambulatory referral to ENT   Patient was given clear instructions to go to Emergency Department or return to medical center if symptoms don't improve, worsen, or new problems develop.The patient  verbalized understanding.  I discussed the assessment and treatment plan with the patient. The patient was provided an opportunity to ask questions and all were answered. The patient agreed with the plan and demonstrated an understanding of the instructions.   The patient was advised to call back or seek an in-person evaluation if the symptoms worsen or if the condition fails to improve as anticipated.    Ricky Stabs, NP 07/21/2023, 8:40 AM Primary Care at [facility]

## 2023-07-21 NOTE — Progress Notes (Signed)
Patient states she's looking for second opinion on ear problem she has been having for a while now.   States rash on left leg.   States multiple complaints.  Patient declined Flu vac

## 2023-07-22 LAB — COVID-19, FLU A+B AND RSV
Influenza A, NAA: NOT DETECTED
Influenza B, NAA: NOT DETECTED
RSV, NAA: NOT DETECTED
SARS-CoV-2, NAA: NOT DETECTED

## 2023-08-03 ENCOUNTER — Ambulatory Visit
Admission: EM | Admit: 2023-08-03 | Discharge: 2023-08-03 | Disposition: A | Discharge status: Home / Self Care | Payer: Medicaid Other | Attending: Physician Assistant | Admitting: Physician Assistant

## 2023-08-03 DIAGNOSIS — J069 Acute upper respiratory infection, unspecified: Secondary | ICD-10-CM | POA: Diagnosis present

## 2023-08-03 LAB — POCT RAPID STREP A (OFFICE): Rapid Strep A Screen: NEGATIVE

## 2023-08-03 LAB — POCT INFLUENZA A/B
Influenza A, POC: NEGATIVE
Influenza B, POC: NEGATIVE

## 2023-08-03 NOTE — ED Triage Notes (Signed)
"  This started about 2 days ago with burning up, red around my face, that night with sore throat, body soreness continued then with Cough yesterday and then sharp pains in various areas of my body with the soreness, remains the same today". No fever known "possible".

## 2023-08-03 NOTE — ED Provider Notes (Signed)
[name]    CSN: [id number] Arrival date & time: 08/03/23  1696      History   Chief Complaint Chief Complaint  Patient presents with   Cough   Sore Throat    HPI Theresa Sellers is a 22 y.o. female.   Patient here today for evaluation of sore throat, body aches, cough that started yesterday.  She notes she did have some sore throat the night before that.  She has not had any fever.  She denies any vomiting or diarrhea.  She has taken over-the-counter medication with mild relief.  The history is provided by the patient.  Cough Associated symptoms: sore throat   Associated symptoms: no chills, no ear pain, no eye discharge, no fever, no shortness of breath and no wheezing   Sore Throat Pertinent negatives include no abdominal pain and no shortness of breath.    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 No pertinent surgical history.  OB History   No obstetric history on file.      Home Medications    Prior to Admission medications   Medication Sig Start Date End Date Taking? Authorizing Provider  benzonatate (TESSALON PERLES) 100 MG capsule Take 1 capsule (100 mg total) by mouth 3 (three) times daily as needed for cough. 07/21/23   Rema Fendt, NP  benzoyl peroxide-erythromycin [facility]) gel Apply topically 2 (two) times daily. Patient not taking: Reported  on 07/21/2023 06/09/22   Bess Kinds, MD  cetirizine (ZYRTEC) 10 MG tablet Take 10 mg by mouth as needed for allergies.    [provider]  clotrimazole (LOTRIMIN) 1 % cream Apply to affected area 2 times daily 07/21/23   Rema Fendt, NP  [facility] D3 10 MCG (400 UNIT) CAPS TAKE 1 TABLET (400 UNITS TOTAL) BY MOUTH DAILY. Patient not taking: Reported on 07/21/2023 12/18/19   Kalman Jewels, MD  fexofenadine (ALLEGRA) 180 MG tablet Take 1 tablet (180 mg total) by mouth daily. 05/04/19 06/03/19  Stryffeler, Jonathon Jordan, NP  fluticasone (FLONASE) 50 MCG/ACT nasal spray Place 1 spray into both nostrils daily. Patient not taking: Reported on 07/21/2023 09/25/19   Florestine Avers [place], MD  ibuprofen (ADVIL) 200 MG tablet Take 200 mg by mouth every 6 (six) hours as needed. Patient not taking: Reported on 07/21/2023    [provider]  Magnesium Oxide 500 MG TABS Take 1 tablet (500 mg total) by mouth daily. Patient not taking: Reported on 07/21/2023 06/11/20   Keturah Shavers, MD  mupirocin ointment (BACTROBAN) 2 % Apply 1 application topically 2 (two) times daily. Patient not taking: Reported on 07/21/2023 01/22/20   Darrall Dears, MD  polyethylene glycol powder (GLYCOLAX/MIRALAX) 17 GM/SCOOP powder Take 17 g by mouth daily. Patient not taking: Reported on 07/21/2023 11/27/21   Ancil Linsey, MD  riboflavin (VITAMIN B-2) 100 MG TABS tablet  Take 1 tablet (100 mg total) by mouth daily. Patient not taking: Reported on 07/21/2023 06/11/20   Keturah Shavers, MD  Salicylic Acid 27.5 % LIQD Soak foot in warm water for 5 minutes then dry- apply salicylic acid to entire wart surface, allow to dry then reapply. Use 1-2 times daily for 4 weeks or until wart resolves. Patient not taking: Reported on 07/21/2023 08/19/22   Tomi Bamberger, PA-C  topiramate (TOPAMAX) 50 MG tablet Take 1 tablet (50 mg total) by mouth 2 (two) times daily. (Start with half a tablet twice daily for the first  week) Patient not taking: Reported on 07/21/2023 11/04/20   Keturah Shavers, MD  triamcinolone (KENALOG) 0.025 % cream Apply 1 Application topically 2 (two) times daily. 07/21/23   Rema Fendt, NP    Family History Family History  Problem Relation Age of Onset   Cancer Maternal Grandmother    Hypertension Maternal Grandfather    Childhood respiratory disease Brother 6   Obesity Brother 5   Developmental delay Brother 5   Gait disorder Brother 7   Bowel Disease Brother 6    (Bilateral pes planus) Brother 7    (Enlarged tonsils) Brother 6    (Failed hearing screening) Brother 5    (Influenza A) Brother 7    (Lymph node enlargement) Brother 6    (Nevus) Brother 6    (Pterygium of right eye) Brother 5   Migraines Neg Hx    Seizures Neg Hx    Autism Neg Hx    ADD / ADHD Neg Hx    Anxiety disorder Neg Hx    Depression Neg Hx    Bipolar disorder Neg Hx    Schizophrenia Neg Hx     Social History Social History   Tobacco Use   Smoking status: Never   Smokeless tobacco: Never  Vaping Use   Vaping status: Never Used  Substance Use Topics   Alcohol use: No   Drug use: Never     Allergies   Patient has no known allergies.   Review of Systems Review of Systems  Constitutional:  Negative for chills and fever.  HENT:  Positive for congestion and sore throat. Negative for ear pain.   Eyes:  Negative for discharge and redness.  Respiratory:  Positive for cough. Negative for shortness of breath and wheezing.   Gastrointestinal:  Negative for abdominal pain, diarrhea, nausea and vomiting.     Physical Exam Triage Vital Signs ED Triage Vitals  Encounter Vitals Group     BP 08/03/23 0943 106/71     Systolic BP Percentile --      Diastolic BP Percentile --      Pulse Rate 08/03/23 0943 (!) 103     Resp 08/03/23 0943 18     Temp 08/03/23 0943 98 F (36.7 C)     Temp Source 08/03/23 0943 Oral     SpO2 08/03/23 0943 98 %     Weight 08/03/23 0941 200 lb (90.7 kg)      Height 08/03/23 0941 5\' 8"  (1.727 m)     Head Circumference --      Peak Flow --      Pain Score 08/03/23 0939 4     Pain Loc --      Pain Education --      Exclude from Growth Chart --    No data found.  Updated Vital Signs BP 106/71 (BP Location: Left Arm)   Pulse (!) 103  Temp 98 F (36.7 C) (Oral)   Resp 18   Ht 5\' 8"  (1.727 m)   Wt 200 lb (90.7 kg)   LMP 07/12/2023 (Exact Date)   SpO2 98%   BMI 30.41 kg/m   Visual Acuity Right Eye Distance:   Left Eye Distance:   Bilateral Distance:    Right Eye Near:   Left Eye Near:    Bilateral Near:     Physical Exam Vitals and nursing note reviewed.  Constitutional:      General: She is not in acute distress.    Appearance: Normal appearance. She is not ill-appearing.  HENT:     Head: Normocephalic and atraumatic.     Right Ear: Tympanic membrane normal.     Left Ear: Tympanic membrane normal.     Nose: Congestion present.     Mouth/Throat:     Mouth: Mucous membranes are moist.     Pharynx: No oropharyngeal exudate or posterior oropharyngeal erythema.  Eyes:     Conjunctiva/sclera: Conjunctivae normal.  Cardiovascular:     Rate and Rhythm: Normal rate and regular rhythm.     Heart sounds: Normal heart sounds. No murmur heard. Pulmonary:     Effort: Pulmonary effort is normal. No respiratory distress.     Breath sounds: Normal breath sounds. No wheezing, rhonchi or rales.  Skin:    General: Skin is warm and dry.  Neurological:     Mental Status: She is alert.  Psychiatric:        Mood and Affect: Mood normal.        Thought Content: Thought content normal.      UC Treatments / Results  Labs (all labs ordered are listed, but only abnormal results are displayed) Labs Reviewed  POCT RAPID STREP A (OFFICE) - Normal  POCT INFLUENZA A/B - Normal  SARS CORONAVIRUS 2 (TAT 6-24 HRS)    EKG   Radiology No results found.  Procedures Procedures (including critical care time)  Medications Ordered in  UC Medications - No data to display  Initial Impression / Assessment and Plan / UC Course  I have reviewed the triage vital signs and the nursing notes.  Pertinent labs & imaging results that were available during my care of the patient were reviewed by me and considered in my medical decision making (see chart for details).    Strep and flu test negative in office.  Will order COVID screening.  Discussed likely viral etiology of symptoms and recommended symptomatic treatment, increase fluids and rest with follow-up if no gradual improvement or with any further concerns.  Patient expresses understanding.  Final Clinical Impressions(s) / UC Diagnoses   Final diagnoses:  Acute upper respiratory infection   Discharge Instructions   None    ED Prescriptions   None    PDMP not reviewed this encounter.   Tomi Bamberger, PA-C 08/03/23 1154

## 2023-08-04 LAB — SARS CORONAVIRUS 2 (TAT 6-24 HRS): SARS Coronavirus 2: NEGATIVE

## 2023-09-05 ENCOUNTER — Ambulatory Visit: Payer: Self-pay | Admitting: *Deleted

## 2023-09-05 ENCOUNTER — Other Ambulatory Visit: Payer: Self-pay | Admitting: Family

## 2023-09-05 ENCOUNTER — Ambulatory Visit (INDEPENDENT_AMBULATORY_CARE_PROVIDER_SITE_OTHER): Payer: Medicaid Other

## 2023-09-05 ENCOUNTER — Encounter: Payer: Self-pay | Admitting: Family

## 2023-09-05 ENCOUNTER — Ambulatory Visit (INDEPENDENT_AMBULATORY_CARE_PROVIDER_SITE_OTHER): Payer: Medicaid Other | Admitting: Family

## 2023-09-05 VITALS — BP 122/84 | HR 115 | Temp 98.7°F | Ht 67.5 in | Wt 200.8 lb

## 2023-09-05 DIAGNOSIS — R0600 Dyspnea, unspecified: Secondary | ICD-10-CM

## 2023-09-05 DIAGNOSIS — R0989 Other specified symptoms and signs involving the circulatory and respiratory systems: Secondary | ICD-10-CM

## 2023-09-05 DIAGNOSIS — J029 Acute pharyngitis, unspecified: Secondary | ICD-10-CM | POA: Diagnosis not present

## 2023-09-05 DIAGNOSIS — R059 Cough, unspecified: Secondary | ICD-10-CM

## 2023-09-05 LAB — POCT RAPID STREP A (OFFICE): Rapid Strep A Screen: NEGATIVE

## 2023-09-05 MED ORDER — ALBUTEROL SULFATE HFA 108 (90 BASE) MCG/ACT IN AERS: 1.0000 | INHALATION_SPRAY | Freq: Four times a day (QID) | RESPIRATORY_TRACT | 1 refills | Status: AC | PRN

## 2023-09-05 MED ORDER — CETIRIZINE HCL 10 MG PO TABS: 10.0000 mg | ORAL_TABLET | ORAL | 1 refills | Status: AC | PRN

## 2023-09-05 MED ORDER — BENZONATATE 100 MG PO CAPS: 100.0000 mg | ORAL_CAPSULE | Freq: Three times a day (TID) | ORAL | 1 refills | Status: AC | PRN

## 2023-09-05 MED ORDER — PREDNISONE 10 MG PO TABS: ORAL_TABLET | ORAL | 0 refills | Status: AC

## 2023-09-05 MED ORDER — PSEUDOEPH-BROMPHEN-DM 30-2-10 MG/5ML PO SYRP: 5.0000 mL | ORAL_SOLUTION | Freq: Four times a day (QID) | ORAL | 0 refills | Status: AC | PRN

## 2023-09-05 MED ORDER — AMOXICILLIN-POT CLAVULANATE 875-125 MG PO TABS: 1.0000 | ORAL_TABLET | Freq: Two times a day (BID) | ORAL | 0 refills | Status: AC

## 2023-09-05 NOTE — Progress Notes (Signed)
Patient states no other concerns to discuss.  States symptoms started happening a few days ago.

## 2023-09-05 NOTE — Telephone Encounter (Signed)
  Chief Complaint: SOB with exertion, cold sx Symptoms: runny nose, cough yellow mucus, cold sx, pain in chest with coughing, SOB with exertion and some at rest.  Frequency: yesterday  Pertinent Negatives: Patient denies chest pain now no SOB now no fever Disposition: [] ED /[] Urgent Care (no appt availability in office) / [x] Appointment(In office/virtual)/ []  [facility]/ [] Home Care/ [] Refused Recommended Disposition /[] [facility]/ []  Follow-up with PCP Additional Notes:    Appt scheduled today .       Reason for Disposition  [1] MILD difficulty breathing (e.g., minimal/no SOB at rest, SOB with walking, pulse <100) AND [2] NEW-onset or WORSE than normal  Answer Assessment - Initial Assessment Questions 1. RESPIRATORY STATUS: "Describe your breathing?" (e.g., wheezing, shortness of breath, unable to speak, severe coughing)      Shortness of breath chest tightness with coughing  2. ONSET: "When did this breathing problem begin?"      Worsening  3. PATTERN "Does the difficult breathing come and go, or has it been constant since it started?"      Comes and goes  4. SEVERITY: "How bad is your breathing?" (e.g., mild, moderate, severe)    - MILD: No SOB at rest, mild SOB with walking, speaks normally in sentences, can lie down, no retractions, pulse < 100.    - MODERATE: SOB at rest, SOB with minimal exertion and prefers to sit, cannot lie down flat, speaks in phrases, mild retractions, audible wheezing, pulse 100-120.    - SEVERE: Very SOB at rest, speaks in single words, struggling to breathe, sitting hunched forward, retractions, pulse > 120      Moderate SOB 5. RECURRENT SYMPTOM: "Have you had difficulty breathing before?" If Yes, ask: "When was the last time?" and "What happened that time?"      Na  6. CARDIAC HISTORY: "Do you have any history of heart disease?" (e.g., heart attack, angina, bypass surgery, angioplasty)      Na  7. LUNG HISTORY: "Do you have any  history of lung disease?"  (e.g., pulmonary embolus, asthma, emphysema)     Na  8. CAUSE: "What do you think is causing the breathing problem?"      Cold sx  9. OTHER SYMPTOMS: "Do you have any other symptoms? (e.g., dizziness, runny nose, cough, chest pain, fever)     Runny nose cough yellow mucus, SOB with exertion and comes and goes at rest 10. O2 SATURATION MONITOR:  "Do you use an oxygen saturation monitor (pulse oximeter) at home?" If Yes, ask: "What is your reading (oxygen level) today?" "What is your usual oxygen saturation reading?" (e.g., 95%)       na 11. PREGNANCY: "Is there any chance you are pregnant?" "When was your last menstrual period?"       na 12. TRAVEL: "Have you traveled out of the country in the last month?" (e.g., travel history, exposures)       na  Protocols used: Breathing Difficulty-A-AH

## 2023-09-05 NOTE — Progress Notes (Signed)
Patient ID: Theresa Sellers, female    DOB: [birth year]  MRN: [id number]  CC: Flu-like Symptoms   Subjective: Theresa Sellers is a 22 y.o. female who presents for flu-like symptoms.   Her concerns today include:  Cough, runny nose, and sore throat for 2 days. States last night shortness of breath and had to sleep on pillows to help. Denies chest pain and additional red flag symptoms. States was recently around her sister who was also sick. 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 (Patient not taking: Reported on 09/05/2023) 23.3 g 0   clotrimazole (LOTRIMIN) 1 % cream Apply to affected area 2 times daily (Patient not taking: Reported on 09/05/2023) 60 g 0   [employer] D3 10 MCG (400 UNIT) CAPS TAKE 1 TABLET (400 UNITS TOTAL) BY MOUTH DAILY. (Patient not taking: Reported on 09/05/2023) 30 capsule 3   fexofenadine (ALLEGRA) 180 MG tablet Take 1 tablet (180 mg total) by mouth daily. 30 tablet 1   fluticasone (FLONASE) 50 MCG/ACT nasal spray Place 1 spray into both nostrils daily. (Patient not taking: Reported on 09/05/2023) 9.9 mL 11   ibuprofen (ADVIL) 200 MG tablet Take 200 mg by mouth every 6 (six) hours as needed. (Patient not taking: Reported on  09/05/2023)     Magnesium Oxide 500 MG TABS Take 1 tablet (500 mg total) by mouth daily. (Patient not taking: Reported on 09/05/2023)  0   mupirocin ointment (BACTROBAN) 2 % Apply 1 application topically 2 (two) times daily. (Patient not taking: Reported on 09/05/2023) 22 g 0   polyethylene glycol powder (GLYCOLAX/MIRALAX) 17 GM/SCOOP powder Take 17 g by mouth daily. (Patient not taking: Reported on 09/05/2023) 527 g 3   riboflavin (VITAMIN B-2) 100 MG TABS tablet Take 1 tablet (100 mg total) by mouth daily. (Patient not taking: Reported on 09/05/2023)  0   Salicylic Acid 27.5 % LIQD Soak foot in warm water for 5 minutes then dry- apply salicylic acid to entire wart surface, allow to dry then reapply. Use 1-2 times daily for 4 weeks or until wart resolves. (Patient not taking: Reported on 09/05/2023) 10 mL 0   topiramate (TOPAMAX) 50 MG tablet Take 1 tablet (50 mg total) by mouth 2 (two) times daily. (Start with half a tablet twice daily for the first week) (Patient not taking: Reported on 09/05/2023) 60 tablet 2   triamcinolone (KENALOG) 0.025 % cream Apply 1 Application topically 2 (two) times daily. (Patient not taking: Reported on 09/05/2023) 60 g 0   No current facility-administered medications on file prior to visit.    No Known Allergies  Social History   Socioeconomic History   Marital status: Single    Spouse name: Not on file  Number of children: Not on file   Years of education: Not on file   Highest education level: Not on file  Occupational History   Not on file  Tobacco Use   Smoking status: Never   Smokeless tobacco: Never  Vaping Use   Vaping status: Never Used  Substance and Sexual Activity   Alcohol use: No   Drug use: Never   Sexual activity: Not Currently  Other Topics Concern   Not on file  Social History Narrative   Lives with mom, dad, sister and brother. She is in the 12th grade at [employer]   Social Drivers of Health   Financial Resource Strain:  Low Risk  (07/21/2023)   Overall Financial Resource Strain (CARDIA)    Difficulty of Paying Living Expenses: Not hard at all  Food Insecurity: No Food Insecurity (07/21/2023)   Hunger Vital Sign    Worried About Running Out of Food in the Last Year: Never true    Ran Out of Food in the Last Year: Never true  Transportation Needs: No Transportation Needs (07/21/2023)   PRAPARE - Administrator, Civil Service (Medical): No    Lack of Transportation (Non-Medical): No  Physical Activity: Inactive (07/21/2023)   Exercise Vital Sign    Days of Exercise per Week: 0 days    Minutes of Exercise per Session: 0 min  Stress: No Stress Concern Present (07/21/2023)   [employer] of Occupational Health - Occupational Stress Questionnaire    Feeling of Stress : Not at all  Social Connections: Not on file  Intimate Partner Violence: Not At Risk (07/21/2023)   Humiliation, Afraid, Rape, and Kick questionnaire    Fear of Current or Ex-Partner: No    Emotionally Abused: No    Physically Abused: No    Sexually Abused: No    Family History  Problem Relation Age of Onset   Cancer Maternal Grandmother    Hypertension Maternal Grandfather    Childhood respiratory disease Brother 6   Obesity Brother 5   Developmental delay Brother 5   Gait disorder Brother 7   Bowel Disease Brother 6    (Bilateral pes planus) Brother 7    (Enlarged tonsils) Brother 6    (Failed hearing screening) Brother 5    (Influenza A) Brother 7    (Lymph node enlargement) Brother 6    (Nevus) Brother 6    (Pterygium of right eye) Brother 5   Migraines Neg Hx    Seizures Neg Hx    Autism Neg Hx    ADD / ADHD Neg Hx    Anxiety disorder Neg Hx    Depression Neg Hx    Bipolar disorder Neg Hx    Schizophrenia Neg Hx     No past surgical history on file.  ROS: Review of Systems Negative except as stated above  PHYSICAL EXAM: BP 122/84   Pulse (!) 115   Temp 98.7 F (37.1 C) (Oral)   Ht 5' 7.5"  (1.715 m)   Wt 200 lb 12.8 oz (91.1 kg)   LMP 08/09/2023 (Approximate)   SpO2 96%   BMI 30.99 kg/m   Physical Exam HENT:     Head: Normocephalic and atraumatic.     Right Ear: Tympanic membrane, ear canal and external ear normal.     Left Ear: Tympanic membrane, ear canal and external ear normal.     Nose: Nose normal.     Mouth/Throat:     Mouth: Mucous membranes  are moist.     Pharynx: Oropharynx is clear.  Eyes:     Extraocular Movements: Extraocular movements intact.     Conjunctiva/sclera: Conjunctivae normal.     Pupils: Pupils are equal, round, and reactive to light.  Cardiovascular:     Rate and Rhythm: Tachycardia present.     Pulses: Normal pulses.     Heart sounds: Normal heart sounds.  Pulmonary:     Effort: Pulmonary effort is normal.     Breath sounds: Normal breath sounds.  Musculoskeletal:        General: Normal range of motion.     Cervical back: Normal range of motion and neck supple.  Neurological:     General: No focal deficit present.     Mental Status: She is alert and oriented to person, place, and time.  Psychiatric:        Mood and Affect: Mood normal.        Behavior: Behavior normal.     ASSESSMENT AND PLAN: 1. Upper respiratory symptom (Primary) 2. Cough, unspecified type 3. Sore throat 4. Dyspnea, unspecified type - Patient today in office with no cardiopulmonary/acute distress.  - Brompheniramine-Pseudoephedrine-DM, Benzonatate capsules, Prednisone, Amoxicillin-Clavulanate, Cetirizine, and Albuterol inhaler as prescribed. Counseled on medication adherence/adverse effects.  - Routine screening.  - Patient provided with work letter.  - Referral to Pulmonology for evaluation/management.  - Follow-up with primary provider as scheduled.  - COVID-19, Flu A+B and RSV - POCT rapid strep A; Future - Culture, Group A Strep - brompheniramine-pseudoephedrine-DM 30-2-10 MG/5ML syrup; Take 5 mLs by mouth 4 (four) times daily as needed.  Dispense:  120 mL; Refill: 0 - benzonatate (TESSALON PERLES) 100 MG capsule; Take 1 capsule (100 mg total) by mouth 3 (three) times daily as needed for cough.  Dispense: 30 capsule; Refill: 1 - predniSONE (DELTASONE) 10 MG tablet; Take 6 tablets (60 mg total) by mouth daily with breakfast for 1 day, THEN 5 tablets (50 mg total) daily with breakfast for 1 day, THEN 4 tablets (40 mg total) daily with breakfast for 1 day, THEN 3 tablets (30 mg total) daily with breakfast for 1 day, THEN 2 tablets (20 mg total) daily with breakfast for 1 day, THEN 1 tablet (10 mg total) daily with breakfast for 1 day.  Dispense: 21 tablet; Refill: 0 - amoxicillin-clavulanate (AUGMENTIN) 875-125 MG tablet; Take 1 tablet by mouth 2 (two) times daily.  Dispense: 20 tablet; Refill: 0 - cetirizine (ZYRTEC) 10 MG tablet; Take 1 tablet (10 mg total) by mouth as needed for allergies.  Dispense: 30 tablet; Refill: 1 - Ambulatory referral to Pulmonology - albuterol (VENTOLIN HFA) 108 (90 Base) MCG/ACT inhaler; Inhale 1-2 puffs into the lungs every 6 (six) hours as needed for wheezing or shortness of breath.  Dispense: 8 g; Refill: 1 - DG Chest 2 View; Future   Patient was given the opportunity to ask questions.  Patient verbalized understanding of the plan and was able to repeat key elements of the plan. Patient was given clear instructions to go to Emergency Department or return to medical center if symptoms don't improve, worsen, or new problems develop.The patient verbalized understanding.   Orders Placed This Encounter  Procedures   COVID-19, Flu A+B and RSV   Culture, Group A Strep   DG Chest 2 View   Ambulatory referral to Pulmonology   POCT rapid strep A     Requested Prescriptions   Signed Prescriptions Disp Refills   brompheniramine-pseudoephedrine-DM 30-2-10 MG/5ML syrup 120 mL  0    Sig: Take 5 mLs by mouth 4 (four) times daily as needed.   benzonatate (TESSALON PERLES) 100 MG capsule 30 capsule 1    Sig: Take 1  capsule (100 mg total) by mouth 3 (three) times daily as needed for cough.   predniSONE (DELTASONE) 10 MG tablet 21 tablet 0    Sig: Take 6 tablets (60 mg total) by mouth daily with breakfast for 1 day, THEN 5 tablets (50 mg total) daily with breakfast for 1 day, THEN 4 tablets (40 mg total) daily with breakfast for 1 day, THEN 3 tablets (30 mg total) daily with breakfast for 1 day, THEN 2 tablets (20 mg total) daily with breakfast for 1 day, THEN 1 tablet (10 mg total) daily with breakfast for 1 day.   amoxicillin-clavulanate (AUGMENTIN) 875-125 MG tablet 20 tablet 0    Sig: Take 1 tablet by mouth 2 (two) times daily.   cetirizine (ZYRTEC) 10 MG tablet 30 tablet 1    Sig: Take 1 tablet (10 mg total) by mouth as needed for allergies.   albuterol (VENTOLIN HFA) 108 (90 Base) MCG/ACT inhaler 8 g 1    Sig: Inhale 1-2 puffs into the lungs every 6 (six) hours as needed for wheezing or shortness of breath.    Follow-up with primary provider as scheduled.   Rema Fendt, NP

## 2023-09-06 LAB — COVID-19, FLU A+B AND RSV
Influenza A, NAA: NOT DETECTED
Influenza B, NAA: NOT DETECTED
RSV, NAA: NOT DETECTED
SARS-CoV-2, NAA: NOT DETECTED

## 2023-09-07 LAB — CULTURE, GROUP A STREP: Strep A Culture: NEGATIVE

## 2023-10-28 ENCOUNTER — Encounter: Payer: Medicaid Other | Admitting: Family
